# Patient Record
Sex: Male | Born: 1980 | Race: White | Hispanic: No | Marital: Married | Smoking: Current every day smoker
Health system: Southern US, Community
[De-identification: ages and names within clinical notes are randomized; demographics above are authoritative.]

## PROBLEM LIST (undated history)

## (undated) DIAGNOSIS — R569 Unspecified convulsions: Secondary | ICD-10-CM

## (undated) HISTORY — PX: ARTHROSCOPIC REPAIR ACL: SUR80

---
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
## 2013-10-07 ENCOUNTER — Emergency Department (HOSPITAL_COMMUNITY): Payer: Self-pay

## 2013-10-07 ENCOUNTER — Emergency Department (HOSPITAL_COMMUNITY)
Admission: EM | Admit: 2013-10-07 | Discharge: 2013-10-07 | Disposition: A | Discharge status: Home / Self Care | Payer: Self-pay | Attending: Emergency Medicine | Admitting: Emergency Medicine

## 2013-10-07 ENCOUNTER — Encounter (HOSPITAL_COMMUNITY): Payer: Self-pay | Admitting: Emergency Medicine

## 2013-10-07 DIAGNOSIS — W503XXA Accidental bite by another person, initial encounter: Secondary | ICD-10-CM | POA: Insufficient documentation

## 2013-10-07 DIAGNOSIS — R569 Unspecified convulsions: Secondary | ICD-10-CM | POA: Insufficient documentation

## 2013-10-07 DIAGNOSIS — R55 Syncope and collapse: Secondary | ICD-10-CM | POA: Insufficient documentation

## 2013-10-07 DIAGNOSIS — R42 Dizziness and giddiness: Secondary | ICD-10-CM | POA: Insufficient documentation

## 2013-10-07 DIAGNOSIS — Y9389 Activity, other specified: Secondary | ICD-10-CM | POA: Insufficient documentation

## 2013-10-07 DIAGNOSIS — R61 Generalized hyperhidrosis: Secondary | ICD-10-CM | POA: Insufficient documentation

## 2013-10-07 DIAGNOSIS — Y929 Unspecified place or not applicable: Secondary | ICD-10-CM | POA: Insufficient documentation

## 2013-10-07 DIAGNOSIS — Z79899 Other long term (current) drug therapy: Secondary | ICD-10-CM | POA: Insufficient documentation

## 2013-10-07 DIAGNOSIS — S01502A Unspecified open wound of oral cavity, initial encounter: Secondary | ICD-10-CM | POA: Insufficient documentation

## 2013-10-07 LAB — RAPID URINE DRUG SCREEN, HOSP PERFORMED
Barbiturates: NOT DETECTED
Cocaine: NOT DETECTED
Opiates: NOT DETECTED

## 2013-10-07 LAB — CBC WITH DIFFERENTIAL/PLATELET
Eosinophils Relative: 0 % (ref 0–5)
HCT: 44 % (ref 39.0–52.0)
Hemoglobin: 15 g/dL (ref 13.0–17.0)
Lymphocytes Relative: 6 % — ABNORMAL LOW (ref 12–46)
Lymphs Abs: 1 10*3/uL (ref 0.7–4.0)
MCV: 84.8 fL (ref 78.0–100.0)
Monocytes Absolute: 0.6 10*3/uL (ref 0.1–1.0)
Monocytes Relative: 4 % (ref 3–12)
Neutro Abs: 14.1 10*3/uL — ABNORMAL HIGH (ref 1.7–7.7)
Neutrophils Relative %: 90 % — ABNORMAL HIGH (ref 43–77)
RBC: 5.19 MIL/uL (ref 4.22–5.81)
RDW: 13 % (ref 11.5–15.5)
WBC: 15.8 10*3/uL — ABNORMAL HIGH (ref 4.0–10.5)

## 2013-10-07 LAB — COMPREHENSIVE METABOLIC PANEL
CO2: 22 mEq/L (ref 19–32)
Calcium: 9.6 mg/dL (ref 8.4–10.5)
Creatinine, Ser: 0.85 mg/dL (ref 0.50–1.35)
GFR calc Af Amer: >90 mL/min (ref >90)
GFR calc non Af Amer: >90 mL/min (ref >90)
Glucose, Bld: 177 mg/dL — ABNORMAL HIGH (ref 70–99)
Potassium: 4.7 mEq/L (ref 3.5–5.1)
Total Bilirubin: 0.2 mg/dL — ABNORMAL LOW (ref 0.3–1.2)

## 2013-10-07 LAB — URINE MICROSCOPIC-ADD ON

## 2013-10-07 LAB — TROPONIN I: Troponin I: <0.3 ng/mL (ref <0.30)

## 2013-10-07 LAB — URINALYSIS, ROUTINE W REFLEX MICROSCOPIC
Bilirubin Urine: NEGATIVE
Nitrite: NEGATIVE
Specific Gravity, Urine: >1.03 — ABNORMAL HIGH (ref 1.005–1.030)
Urobilinogen, UA: 0.2 mg/dL (ref 0.0–1.0)

## 2013-10-07 MED ORDER — SODIUM CHLORIDE 0.9 % IV SOLN
INTRAVENOUS | Status: DC
  Administered 2013-10-07: 18:00:00 via INTRAVENOUS

## 2013-10-07 MED ORDER — ONDANSETRON HCL 4 MG/2ML IJ SOLN: 4.0000 mg | Freq: Once | INTRAMUSCULAR | Status: DC

## 2013-10-07 MED ORDER — ONDANSETRON HCL 4 MG/2ML IJ SOLN
INTRAMUSCULAR | Status: AC
  Administered 2013-10-07: 4 mg
  Filled 2013-10-07: qty 2
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
## 2013-10-07 NOTE — Progress Notes (Signed)
ED/CM noted patient did not have health insurance and/or PCP listed in the computer.  Patient was given the [place] resource handout with information on the clinics, food pantries, and the handout for new health insurance sign-up.  Patient expressed appreciation for this. 
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
## 2013-10-14 ENCOUNTER — Other Ambulatory Visit: Payer: Self-pay | Admitting: Neurology

## 2013-10-14 DIAGNOSIS — R42 Dizziness and giddiness: Secondary | ICD-10-CM

## 2013-10-29 ENCOUNTER — Encounter (HOSPITAL_COMMUNITY): Payer: Self-pay

## 2013-10-29 ENCOUNTER — Ambulatory Visit (HOSPITAL_COMMUNITY)
Admission: RE | Admit: 2013-10-29 | Discharge: 2013-10-29 | Disposition: A | Discharge status: Home / Self Care | Payer: BC Managed Care – PPO | Source: Ambulatory Visit | Attending: Neurology | Admitting: Neurology

## 2013-10-29 DIAGNOSIS — R42 Dizziness and giddiness: Secondary | ICD-10-CM

## 2013-10-29 DIAGNOSIS — R569 Unspecified convulsions: Secondary | ICD-10-CM | POA: Insufficient documentation

## 2013-10-29 MED ORDER — GADOBENATE DIMEGLUMINE 529 MG/ML IV SOLN
20.0000 mL | Freq: Once | INTRAVENOUS | Status: AC | PRN
  Administered 2013-10-29: 20 mL via INTRAVENOUS

## 2013-11-09 ENCOUNTER — Ambulatory Visit: Payer: Self-pay | Admitting: Neurology

## 2015-09-01 ENCOUNTER — Encounter (HOSPITAL_COMMUNITY): Payer: Self-pay | Admitting: Emergency Medicine

## 2015-09-01 ENCOUNTER — Emergency Department (HOSPITAL_COMMUNITY): Payer: BLUE CROSS/BLUE SHIELD

## 2015-09-01 ENCOUNTER — Emergency Department (HOSPITAL_COMMUNITY)
Admission: EM | Admit: 2015-09-01 | Discharge: 2015-09-01 | Disposition: A | Discharge status: Home / Self Care | Payer: BLUE CROSS/BLUE SHIELD | Attending: Emergency Medicine | Admitting: Emergency Medicine

## 2015-09-01 DIAGNOSIS — Z79899 Other long term (current) drug therapy: Secondary | ICD-10-CM | POA: Diagnosis not present

## 2015-09-01 DIAGNOSIS — M5431 Sciatica, right side: Secondary | ICD-10-CM | POA: Insufficient documentation

## 2015-09-01 DIAGNOSIS — M545 Low back pain: Secondary | ICD-10-CM | POA: Diagnosis present

## 2015-09-01 HISTORY — DX: Unspecified convulsions: R56.9

## 2015-09-01 MED ORDER — OXYCODONE-ACETAMINOPHEN 5-325 MG PO TABS: 1.0000 | ORAL_TABLET | ORAL | Status: DC | PRN

## 2015-09-01 MED ORDER — METHOCARBAMOL 500 MG PO TABS: 500.0000 mg | ORAL_TABLET | Freq: Three times a day (TID) | ORAL | Status: DC

## 2015-09-01 MED ORDER — IBUPROFEN 800 MG PO TABS
800.0000 mg | ORAL_TABLET | Freq: Once | ORAL | Status: AC
  Administered 2015-09-01: 800 mg via ORAL
  Filled 2015-09-01: qty 1

## 2015-09-01 MED ORDER — OXYCODONE-ACETAMINOPHEN 5-325 MG PO TABS
1.0000 | ORAL_TABLET | Freq: Once | ORAL | Status: AC
Start: 2015-09-01 — End: 2015-09-01
  Administered 2015-09-01: 1 via ORAL
  Filled 2015-09-01: qty 1

## 2015-09-01 NOTE — Discharge Instructions (Signed)
°  Sciatica °Sciatica is pain, weakness, numbness, or tingling along your sciatic nerve. The nerve starts in the lower back and runs down the back of each leg. Nerve damage or certain conditions pinch or put pressure on the sciatic nerve. This causes the pain, weakness, and other discomforts of sciatica. °HOME CARE  °· Only take medicine as told by your doctor. °· Apply ice to the affected area for 20 minutes. Do this 3-4 times a day for the first 48-72 hours. Then try heat in the same way. °· Exercise, stretch, or do your usual activities if these do not make your pain worse. °· Go to physical therapy as told by your doctor. °· Keep all doctor visits as told. °· Do not wear high heels or shoes that are not supportive. °· Get a firm mattress if your mattress is too soft to lessen pain and discomfort. °GET HELP RIGHT AWAY IF:  °· You cannot control when you poop (bowel movement) or pee (urinate). °· You have more weakness in your lower back, lower belly (pelvis), butt (buttocks), or legs. °· You have redness or puffiness (swelling) of your back. °· You have a burning feeling when you pee. °· You have pain that gets worse when you lie down. °· You have pain that wakes you from your sleep. °· Your pain is worse than past pain. °· Your pain lasts longer than 4 weeks. °· You are suddenly losing weight without reason. °MAKE SURE YOU:  °· Understand these instructions. °· Will watch this condition. °· Will get help right away if you are not doing well or get worse. °  °This information is not intended to replace advice given to you by your health care provider. Make sure you discuss any questions you have with your health care provider. °  °Document Released: 08/21/2008 Document Revised: 08/03/2015 Document Reviewed: 03/23/2012 °Elsevier Interactive Patient Education ©2016 [employer] ° ° °

## 2015-09-01 NOTE — ED Provider Notes (Signed)
CSN: [id number]     Arrival date & time 09/01/15  0759 History   First MD Initiated Contact with Patient 09/01/15 [phone]     Chief Complaint  Patient presents with  . Back Pain     (Consider location/radiation/quality/duration/timing/severity/associated sxs/prior Treatment) HPI Randall Rose is a 34 y.o. male who presents to the Emergency Department complaining of low back pain for 4 days.  Pain was gradual onset and he describes the pain as sharp and intermittently radiating into his right hip and posterior thigh yesterday, but not today.  Pain is worse with movement, improves at rest.  He took tylenol yesterday without relief.  He states the pain is worse this morning.  Also notes pain is not associated with palpation.  He denies fever, chills, urine or bowel changes, abdominal pain, numbness or weakness of the lower extremities.  Patient is a local high school football coach, but states he does not recall any known injury.    No past medical history on file. No past surgical history on file. No family history on file. Social History  Substance Use Topics  . Smoking status: Never Smoker   . Smokeless tobacco: Not on file  . Alcohol Use: Yes     Comment: occasional     Review of Systems  Constitutional: Negative for fever.  Respiratory: Negative for shortness of breath.   Gastrointestinal: Negative for vomiting, abdominal pain and constipation.  Genitourinary: Negative for dysuria, hematuria, flank pain, decreased urine volume and difficulty urinating.  Musculoskeletal: Positive for back pain. Negative for joint swelling.  Skin: Negative for rash.  Neurological: Negative for weakness and numbness.  All other systems reviewed and are negative.     Allergies  Review of patient's allergies indicates no known allergies.  Home Medications   Prior to Admission medications   Medication Sig Start Date End Date Taking? Authorizing Provider  Sesame Oil OIL Take 1 capsule by mouth  daily.    Historical Provider, MD   BP 148/100 mmHg  Pulse 73  Temp(Src) 97.3 F (36.3 C) (Oral)  Resp 18  Ht  (1.803 m)  Wt 260 lb (117.935 kg)  BMI 36.28 kg/m2  SpO2 95%   Physical Exam  Constitutional: He is oriented to person, place, and time. He appears well-developed and well-nourished. No distress.  HENT:  Head: Normocephalic and atraumatic.  Neck: Normal range of motion. Neck supple.  Cardiovascular: Normal rate, regular rhythm, normal heart sounds and intact distal pulses.   No murmur heard. Pulmonary/Chest: Effort normal and breath sounds normal. No respiratory distress. He exhibits no tenderness.  Abdominal: Soft. He exhibits no distension. There is no tenderness. There is no rebound, no guarding and no CVA tenderness.  Musculoskeletal: He exhibits tenderness. He exhibits no edema.       Lumbar back: He exhibits tenderness and pain. He exhibits normal range of motion, no swelling, no deformity, no laceration and normal pulse.  Describes ttp of the bilateral lumbar paraspinal muscles, mainly on the right   No spinal tenderness.  DP pulses are brisk and symmetrical.  Distal sensation intact.  Hip Flexors/Extensors are intact.  Pt has 5/5 strength against resistance of bilateral lower extremities.     Neurological: He is alert and oriented to person, place, and time. He has normal strength. No sensory deficit. He exhibits normal muscle tone. Coordination and gait normal.  Reflex Scores:      Patellar reflexes are 2+ on the right side and 2+ on the left  side.      Achilles reflexes are 2+ on the right side and 2+ on the left side. Skin: Skin is warm and dry. No rash noted.  Psychiatric: He has a normal mood and affect.  Nursing note and vitals reviewed.   ED Course  Procedures (including critical care time) Labs Review Labs Reviewed - No data to display  Imaging Review Dg Lumbar Spine Complete  09/01/2015   CLINICAL DATA:  34 year old male with lumbar back pain  radiating to the right side and down the right lower extremity for 4 days. No known injury. Initial encounter.  EXAM: LUMBAR SPINE - COMPLETE 4+ VIEW  COMPARISON:  None.  FINDINGS: For the purposes of this report full size ribs are designated at T11 with hypoplastic or absent ribs at T12. Lumbar segmentation subsequently is normal. Bilateral L5 pars fractures. Trace anterolisthesis at L5-S1 associated with disc space loss and endplate spurring. Vertebral height and alignment elsewhere within normal limits. No other pars fracture. Relatively preserved disc spaces elsewhere. sacral ala and SI joints within normal limits.  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   Electronically Signed   By: Odessa Fleming M.D.   On: 09/01/2015 08:46   I have personally reviewed and evaluated these images and lab results as part of my medical decision-making.   EKG Interpretation None      MDM   Final diagnoses:  Sciatica neuralgia, right    Pt is well appearing, ambulatory with a steady giat.  No focal neuro deficits.  No concerning sx's for emergent neurological or infectious process.  Sx's likely related to sciatica.    Pt is feeling slightly better after medication.  Discussed XR findings.  Pt agrees to symptomatic tx and close PMD f/u in one week if not improving.  He appears stable for d/c.  return precautions also given.      Pauline Aus, PA-C 09/01/15 0945  Lavera Guise, MD 09/02/15 [phone]

## 2015-09-01 NOTE — ED Notes (Signed)
Pt reprots lower back pain since Sunday. Pt denies any known injury but reports intermittent radiation to right hip. Pt denies any n/v/d.

## 2021-06-15 ENCOUNTER — Other Ambulatory Visit: Payer: Self-pay

## 2021-06-15 ENCOUNTER — Encounter (HOSPITAL_COMMUNITY): Payer: Self-pay | Admitting: Pharmacy Technician

## 2021-06-15 ENCOUNTER — Emergency Department (HOSPITAL_COMMUNITY)
Admission: EM | Admit: 2021-06-15 | Discharge: 2021-06-16 | Disposition: A | Discharge status: Home / Self Care | Payer: BC Managed Care – PPO | Attending: Emergency Medicine | Admitting: Emergency Medicine

## 2021-06-15 ENCOUNTER — Emergency Department (HOSPITAL_COMMUNITY): Payer: BC Managed Care – PPO

## 2021-06-15 DIAGNOSIS — N132 Hydronephrosis with renal and ureteral calculous obstruction: Secondary | ICD-10-CM | POA: Insufficient documentation

## 2021-06-15 DIAGNOSIS — F17210 Nicotine dependence, cigarettes, uncomplicated: Secondary | ICD-10-CM | POA: Diagnosis not present

## 2021-06-15 DIAGNOSIS — M549 Dorsalgia, unspecified: Secondary | ICD-10-CM | POA: Diagnosis not present

## 2021-06-15 DIAGNOSIS — R109 Unspecified abdominal pain: Secondary | ICD-10-CM | POA: Diagnosis present

## 2021-06-15 DIAGNOSIS — N201 Calculus of ureter: Secondary | ICD-10-CM

## 2021-06-15 LAB — COMPREHENSIVE METABOLIC PANEL
ALT: 23 U/L (ref 0–44)
AST: 15 U/L (ref 15–41)
Albumin: 4.6 g/dL (ref 3.5–5.0)
Alkaline Phosphatase: 60 U/L (ref 38–126)
Anion gap: 10 (ref 5–15)
BUN: 23 mg/dL — ABNORMAL HIGH (ref 6–20)
CO2: 23 mmol/L (ref 22–32)
Calcium: 9.6 mg/dL (ref 8.9–10.3)
Chloride: 105 mmol/L (ref 98–111)
Creatinine, Ser: 1.12 mg/dL (ref 0.61–1.24)
GFR, Estimated: >60 mL/min (ref >60)
Glucose, Bld: 151 mg/dL — ABNORMAL HIGH (ref 70–99)
Potassium: 4 mmol/L (ref 3.5–5.1)
Sodium: 138 mmol/L (ref 135–145)
Total Bilirubin: 0.9 mg/dL (ref 0.3–1.2)
Total Protein: 7.4 g/dL (ref 6.5–8.1)

## 2021-06-15 LAB — URINALYSIS, ROUTINE W REFLEX MICROSCOPIC
Bilirubin Urine: NEGATIVE
Glucose, UA: NEGATIVE mg/dL
Ketones, ur: 20 mg/dL — AB
Leukocytes,Ua: NEGATIVE
Nitrite: NEGATIVE
Protein, ur: 30 mg/dL — AB
Specific Gravity, Urine: 1.031 — ABNORMAL HIGH (ref 1.005–1.030)
pH: 5 (ref 5.0–8.0)

## 2021-06-15 LAB — CBC WITH DIFFERENTIAL/PLATELET
Abs Immature Granulocytes: 0.11 10*3/uL — ABNORMAL HIGH (ref 0.00–0.07)
Basophils Absolute: 0 10*3/uL (ref 0.0–0.1)
Basophils Relative: 0 %
Eosinophils Absolute: 0 10*3/uL (ref 0.0–0.5)
Eosinophils Relative: 0 %
HCT: 44.5 % (ref 39.0–52.0)
Hemoglobin: 14.8 g/dL (ref 13.0–17.0)
Immature Granulocytes: 1 %
Lymphocytes Relative: 12 %
Lymphs Abs: 1.9 10*3/uL (ref 0.7–4.0)
MCH: 28.8 pg (ref 26.0–34.0)
MCHC: 33.3 g/dL (ref 30.0–36.0)
MCV: 86.7 fL (ref 80.0–100.0)
Monocytes Absolute: 0.8 10*3/uL (ref 0.1–1.0)
Monocytes Relative: 5 %
Neutro Abs: 12.5 10*3/uL — ABNORMAL HIGH (ref 1.7–7.7)
Neutrophils Relative %: 82 %
Platelets: 231 10*3/uL (ref 150–400)
RBC: 5.13 MIL/uL (ref 4.22–5.81)
RDW: 12.6 % (ref 11.5–15.5)
WBC: 15.3 10*3/uL — ABNORMAL HIGH (ref 4.0–10.5)
nRBC: 0 % (ref 0.0–0.2)

## 2021-06-15 LAB — LIPASE, BLOOD: Lipase: 26 U/L (ref 11–51)

## 2021-06-15 MED ORDER — OXYCODONE-ACETAMINOPHEN 5-325 MG PO TABS
1.0000 | ORAL_TABLET | Freq: Once | ORAL | Status: AC
  Administered 2021-06-15: 1 via ORAL
  Filled 2021-06-15: qty 1

## 2021-06-15 MED ORDER — ONDANSETRON 4 MG PO TBDP
4.0000 mg | ORAL_TABLET | Freq: Once | ORAL | Status: AC
  Administered 2021-06-15: 4 mg via ORAL
  Filled 2021-06-15: qty 1

## 2021-06-15 MED ORDER — IBUPROFEN 400 MG PO TABS
600.0000 mg | ORAL_TABLET | Freq: Once | ORAL | Status: AC
  Administered 2021-06-15: 600 mg via ORAL
  Filled 2021-06-15: qty 1

## 2021-06-15 NOTE — ED Provider Notes (Signed)
Emergency Medicine Provider Triage Evaluation Note  STEFEN JUBA , a 40 y.o. male  was evaluated in triage.  Pt complains of right sided flank pain. Pain started earlier today and has been constant since than.  No radiation of pain. Endorses difficulty urinating, chills, nausea, and vomiting.  No fevers, dysuria, hematuria.   Review of Systems  Positive: Flank pain, nausea, vomiting, difficulty urinating Negative: Fevers, dysuria, hematuria, abdominal pain  Physical Exam  BP 140/66   Pulse (!) 52   Temp 98.3 F (36.8 C) (Oral)   Resp 18   SpO2 98%  Gen:   Awake, no distress   Resp:  Normal effort  MSK:   Moves extremities without difficulty  Other:  Abdomen soft, nondistended, nontender.  Tenderness to right flank, positive CVA tenderness.  Medical Decision Making  Medically screening exam initiated at 6:25 PM.  Appropriate orders placed.  JEFFRIE LOFSTROM was informed that the remainder of the evaluation will be completed by another provider, this initial triage assessment does not replace that evaluation, and the importance of remaining in the ED until their evaluation is complete.  The patient appears stable so that the remainder of the work up may be completed by another provider.      Berneice Heinrich 06/15/21 Trenton Gammon, MD 06/19/21 1020

## 2021-06-15 NOTE — ED Triage Notes (Signed)
Pt here pov with reports of having an urge to urinate but unable to go. Pt also reports R flank pain. Denies hematuria.

## 2021-06-16 MED ORDER — OXYCODONE-ACETAMINOPHEN 5-325 MG PO TABS: 1.0000 | ORAL_TABLET | Freq: Four times a day (QID) | ORAL | 0 refills | Status: AC | PRN

## 2021-06-16 MED ORDER — TAMSULOSIN HCL 0.4 MG PO CAPS: 0.4000 mg | ORAL_CAPSULE | Freq: Every day | ORAL | 0 refills | Status: AC

## 2021-06-16 MED ORDER — ONDANSETRON 4 MG PO TBDP: 4.0000 mg | ORAL_TABLET | Freq: Three times a day (TID) | ORAL | 0 refills | Status: AC | PRN

## 2021-06-16 MED ORDER — IBUPROFEN 800 MG PO TABS: 800.0000 mg | ORAL_TABLET | Freq: Three times a day (TID) | ORAL | 0 refills | Status: AC | PRN

## 2021-06-16 MED ORDER — KETOROLAC TROMETHAMINE 30 MG/ML IJ SOLN
30.0000 mg | Freq: Once | INTRAMUSCULAR | Status: AC
  Administered 2021-06-16: 30 mg via INTRAMUSCULAR
  Filled 2021-06-16: qty 1

## 2021-06-16 MED ORDER — SENNOSIDES-DOCUSATE SODIUM 8.6-50 MG PO TABS: 1.0000 | ORAL_TABLET | Freq: Every evening | ORAL | 0 refills | Status: AC | PRN

## 2021-06-16 NOTE — Discharge Instructions (Addendum)

## 2021-06-16 NOTE — ED Provider Notes (Signed)
Emergency Department Provider Note   I have reviewed the triage vital signs and the nursing notes.   HISTORY  Chief Complaint Flank Pain   HPI Randall Rose is a 40 y.o. male with past medical history reviewed below presents to the emergency department with flank pain worsening over the past 12 hours.  He is having a cramping pain in his right back and flank with some urine hesitancy.  No gross hematuria.  No fevers or chills.  No pain into the chest.  No prior history of kidney stones.  When pain worsened significantly he presented to the emergency department. Pain is cramping and severe at times. No modifying factors.   Past Medical History:  Diagnosis Date   Seizures (HCC)     There are no problems to display for this patient.   Past Surgical History:  Procedure Laterality Date   ARTHROSCOPIC REPAIR ACL      Allergies Patient has no known allergies.  No family history on file.  Social History Social History   Tobacco Use   Smoking status: Every Day    Packs/day: 1.00    Types: Cigarettes  Substance Use Topics   Alcohol use: Yes    Comment: occasional    Drug use: No    Review of Systems  Constitutional: No fever/chills Eyes: No visual changes. ENT: No sore throat. Cardiovascular: Denies chest pain. Respiratory: Denies shortness of breath. Gastrointestinal: Positive right abdominal pain.  No nausea, no vomiting.  No diarrhea.  No constipation. Genitourinary: Negative for dysuria. Musculoskeletal: Positive for back pain. Skin: Negative for rash. Neurological: Negative for headaches, focal weakness or numbness.  10-point ROS otherwise negative.  ____________________________________________   PHYSICAL EXAM:  VITAL SIGNS: ED Triage Vitals  Enc Vitals Group     BP 06/15/21 1807 140/66     Pulse Rate 06/15/21 1807 (!) 52     Resp 06/15/21 1807 18     Temp 06/15/21 1807 98.3 F (36.8 C)     Temp Source 06/15/21 1807 Oral     SpO2 06/15/21 1807  98 %   Constitutional: Alert and oriented. Well appearing and in no acute distress. Eyes: Conjunctivae are normal.  Head: Atraumatic. Nose: No congestion/rhinnorhea. Mouth/Throat: Mucous membranes are moist.  Neck: No stridor.   Cardiovascular:Good peripheral circulation.  Respiratory: Normal respiratory effort.   Gastrointestinal: Soft and nontender. No distention.  Musculoskeletal: No gross deformities of extremities. Neurologic:  Normal speech and language.  Skin:  Skin is warm, dry and intact. No rash noted.  ____________________________________________   LABS (all labs ordered are listed, but only abnormal results are displayed)  Labs Reviewed  COMPREHENSIVE METABOLIC PANEL - Abnormal; Notable for the following components:      Result Value   Glucose, Bld 151 (*)    BUN 23 (*)    All other components within normal limits  CBC WITH DIFFERENTIAL/PLATELET - Abnormal; Notable for the following components:   WBC 15.3 (*)    Neutro Abs 12.5 (*)    Abs Immature Granulocytes 0.11 (*)    All other components within normal limits  URINALYSIS, ROUTINE W REFLEX MICROSCOPIC - Abnormal; Notable for the following components:   Color, Urine AMBER (*)    APPearance TURBID (*)    Specific Gravity, Urine 1.031 (*)    Hgb urine dipstick MODERATE (*)    Ketones, ur 20 (*)    Protein, ur 30 (*)    Bacteria, UA FEW (*)    All other components  within normal limits  URINE CULTURE  LIPASE, BLOOD   ____________________________________________  RADIOLOGY  No results found.  ____________________________________________   PROCEDURES  Procedure(s) performed:   Procedures  None  ____________________________________________   INITIAL IMPRESSION / ASSESSMENT AND PLAN / ED COURSE  Pertinent labs & imaging results that were available during my care of the patient were reviewed by me and considered in my medical decision making (see chart for details).   Patient presents emergency  department the right abdominal and back pain.  Hematuria on UA without infection.  CT renal ordered in triage shows a 5 mm stone in the distal right ureter with mild hydronephrosis.  Patient's pain is well controlled here in the emergency department.  Discussed follow-up with urology and pain control strategies at home.  Medication prescribed after review of [place] drug database.  Discussed ED return precautions.    ____________________________________________  FINAL CLINICAL IMPRESSION(S) / ED DIAGNOSES  Final diagnoses:  Ureteral stone     MEDICATIONS GIVEN DURING THIS VISIT:  Medications  ondansetron (ZOFRAN-ODT) disintegrating tablet 4 mg (4 mg Oral Given 06/15/21 1854)  oxyCODONE-acetaminophen (PERCOCET/ROXICET) 5-325 MG per tablet 1 tablet (1 tablet Oral Given 06/15/21 1854)  oxyCODONE-acetaminophen (PERCOCET/ROXICET) 5-325 MG per tablet 1 tablet (1 tablet Oral Given 06/15/21 2103)  ibuprofen (ADVIL) tablet 600 mg (600 mg Oral Given 06/15/21 2103)  ketorolac (TORADOL) 30 MG/ML injection 30 mg (30 mg Intramuscular Given 06/16/21 0601)     NEW OUTPATIENT MEDICATIONS STARTED DURING THIS VISIT:  Discharge Medication List as of 06/16/2021  6:50 AM     START taking these medications   Details  ibuprofen (ADVIL) 800 MG tablet Take 1 tablet (800 mg total) by mouth every 8 (eight) hours as needed for moderate pain., Starting Fri 06/16/2021, Normal    ondansetron (ZOFRAN ODT) 4 MG disintegrating tablet Take 1 tablet (4 mg total) by mouth every 8 (eight) hours as needed for nausea or vomiting., Starting Fri 06/16/2021, Normal    oxyCODONE-acetaminophen (PERCOCET/ROXICET) 5-325 MG tablet Take 1 tablet by mouth every 6 (six) hours as needed for severe pain., Starting Fri 06/16/2021, Normal    senna-docusate (SENOKOT-S) 8.6-50 MG tablet Take 1 tablet by mouth at bedtime as needed for mild constipation., Starting Fri 06/16/2021, Until Sun 07/16/2021 at 2359, Normal    tamsulosin (FLOMAX)  0.4 MG CAPS capsule Take 1 capsule (0.4 mg total) by mouth daily for 14 days., Starting Fri 06/16/2021, Until Fri 06/30/2021, Normal        Note:  This document was prepared using Dragon voice recognition software and may include unintentional dictation errors.  Alona Bene, MD, [facility] Emergency Medicine    Layni Kreamer, Arlyss Repress, MD 06/16/21 [phone]

## 2021-06-17 LAB — URINE CULTURE: Culture: NO GROWTH

## 2022-11-06 IMAGING — CT CT RENAL STONE PROTOCOL
2 of 4 series · 16 of 46 positions shown, 18 images · non-contrast
Comparison: None.

CLINICAL DATA: Flank pain, kidney stone suspected. Right flank
pain.

EXAM:
CT ABDOMEN AND PELVIS WITHOUT CONTRAST
TECHNIQUE: Multidetector CT imaging of the abdomen and pelvis was performed
following the standard protocol without IV contrast.

[Series 3: renal stone 5.0 · axial · 0.98mm/px · z∈[+580,+1105]mm · 13 of 117 slices shown, 15 images]
[im 6/117  soft-tissue]
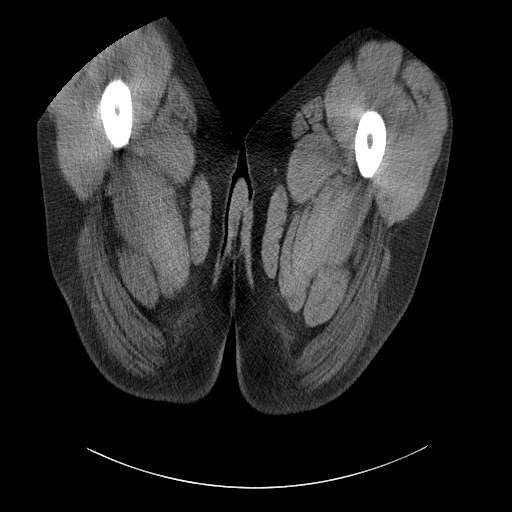
[im 6/117  bone]
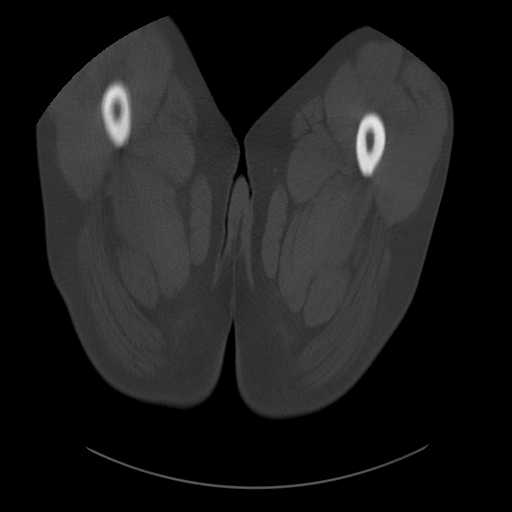
[im 16/117  soft-tissue]
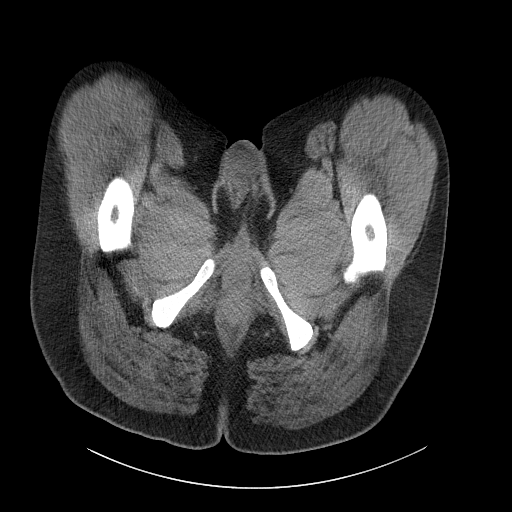
[im 27/117  soft-tissue]
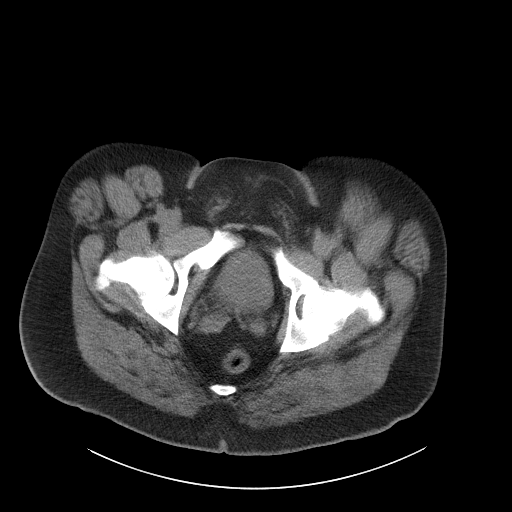
[im 32/117  soft-tissue]
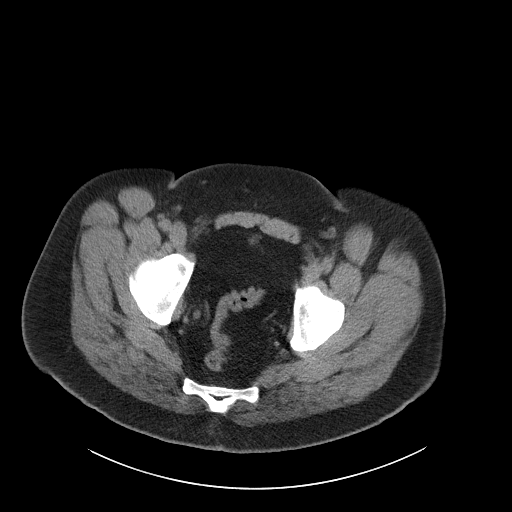
[im 43/117  soft-tissue]
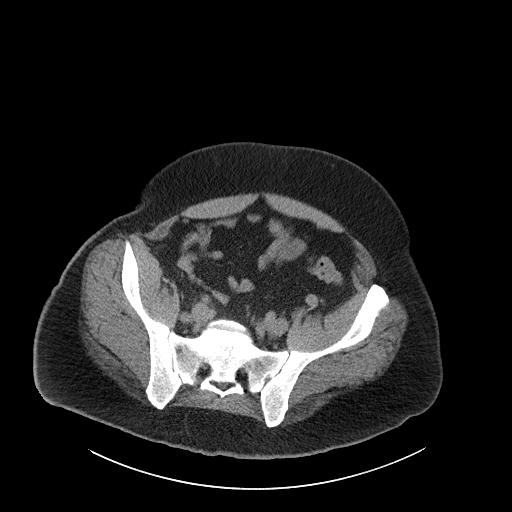
[im 48/117  soft-tissue]
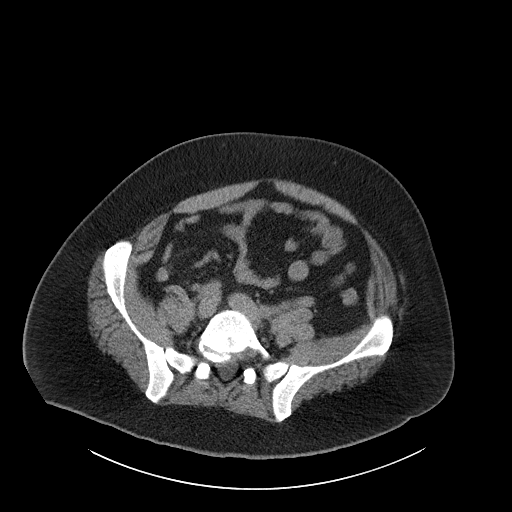
[im 59/117  soft-tissue]
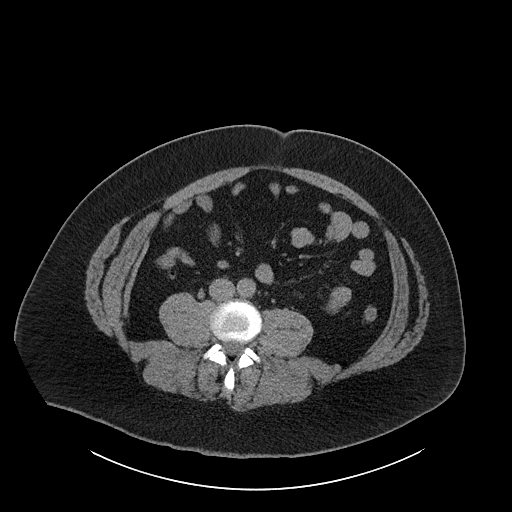
[im 69/117  soft-tissue]
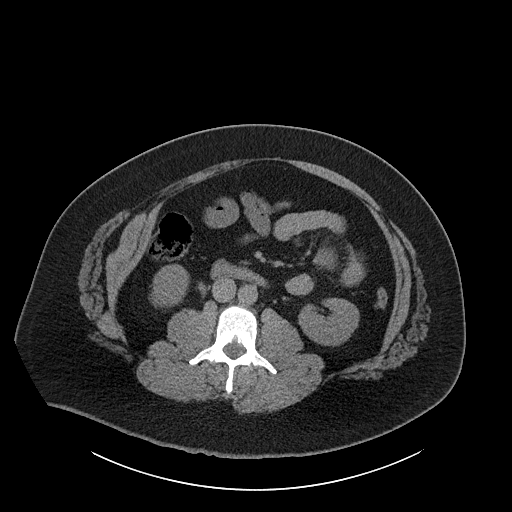
[im 74/117  soft-tissue]
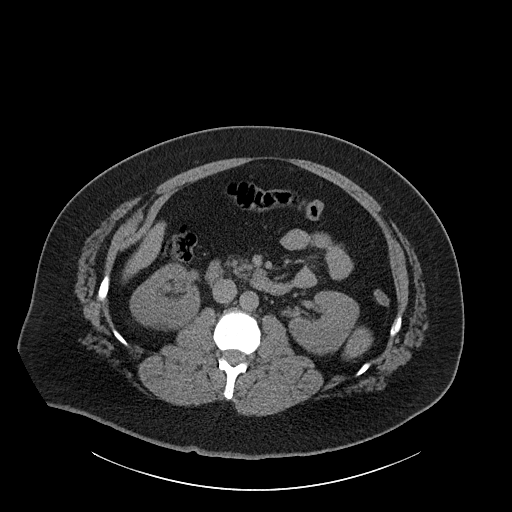
[im 74/117  bone]
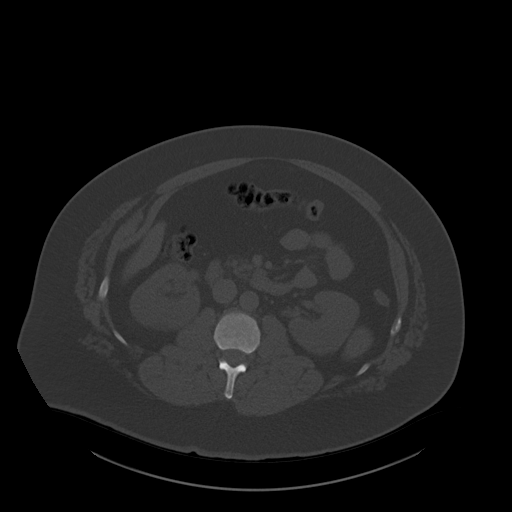
[im 85/117  soft-tissue]
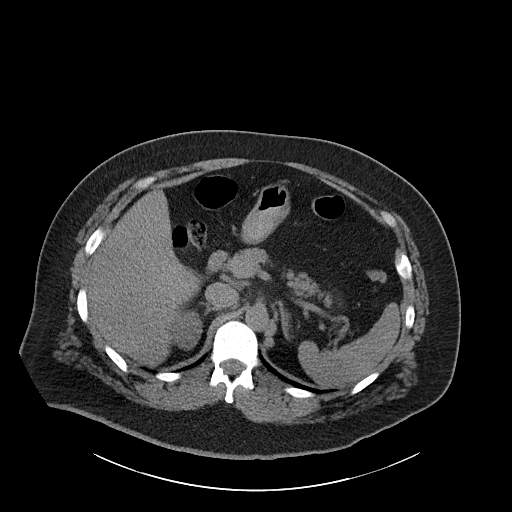
[im 90/117  soft-tissue]
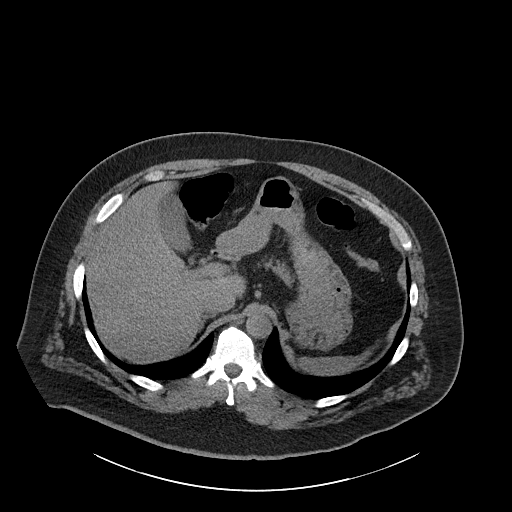
[im 101/117  soft-tissue]
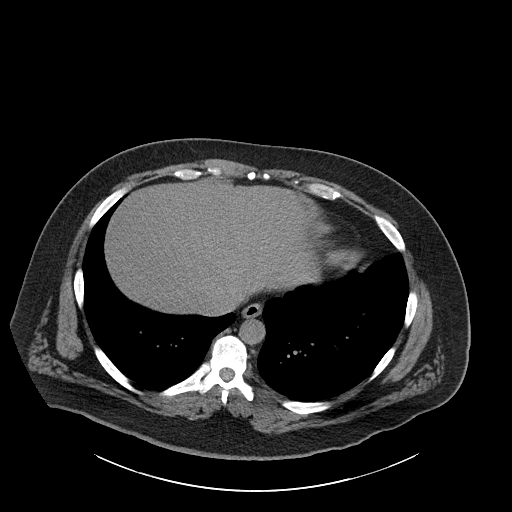
[im 111/117  soft-tissue]
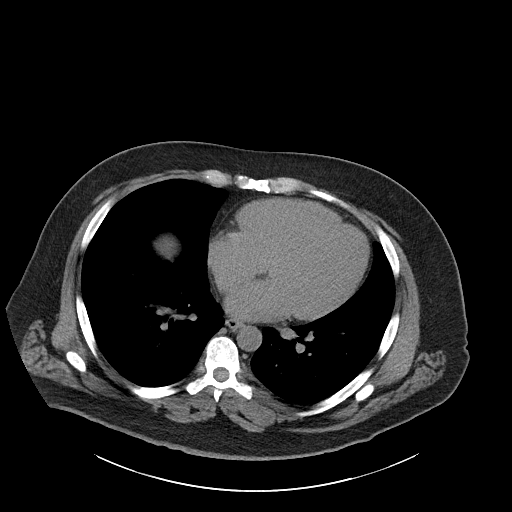

[Series 6: cor · coronal · 0.87mm/px · 3 of 169 slices shown]
[im 57/169  soft-tissue]
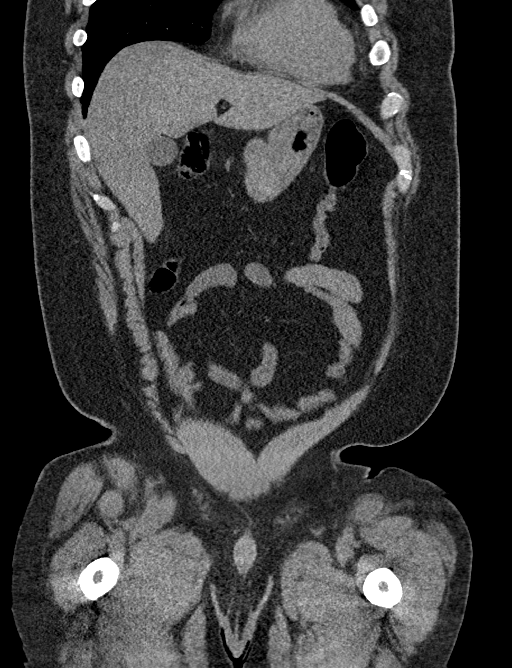
[im 75/169  soft-tissue]
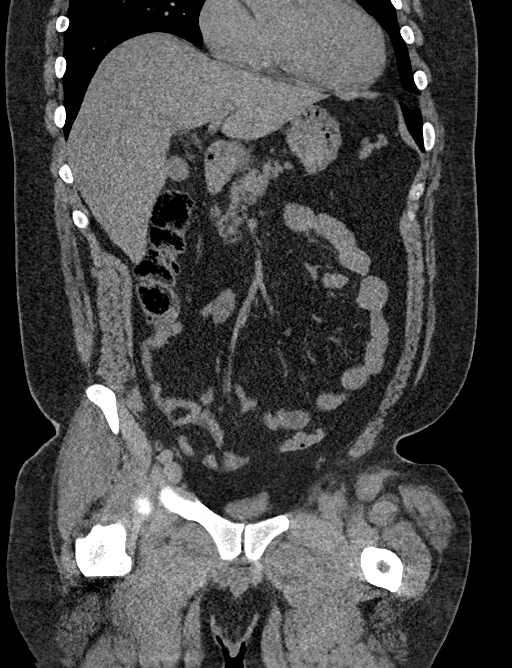
[im 94/169  soft-tissue]
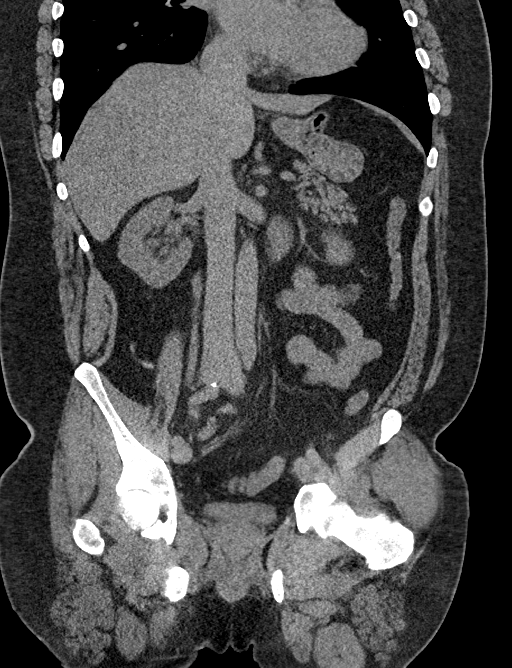

[16 of 46 positions shown; findings below may reference images not displayed]

FINDINGS: LOWER CHEST: Normal.

HEPATOBILIARY: Normal hepatic contours. No intra- or extrahepatic
biliary dilatation. The gallbladder is normal.

PANCREAS: Normal pancreas. No ductal dilatation or peripancreatic
fluid collection.

SPLEEN: Normal.

ADRENALS/URINARY TRACT: The adrenal glands are normal. There is a
stone within the distal ureter measuring 5 mm, causing mild
hydroureteronephrosis and minimal perinephric stranding. No other
renal stones. The urinary bladder is normal for degree of distention

STOMACH/BOWEL: There is no hiatal hernia. Normal duodenal course and
caliber. No small bowel dilatation or inflammation. No focal colonic
abnormality. Normal appendix.

VASCULAR/LYMPHATIC: Normal course and caliber of the major abdominal
vessels. No abdominal or pelvic lymphadenopathy.

REPRODUCTIVE: Normal prostate size with symmetric seminal vesicles.

MUSCULOSKELETAL. No bony spinal canal stenosis or focal osseous
abnormality.

OTHER: None.
IMPRESSION: Right-sided obstructive uropathy with 5 mm stone in the distal right
ureter causing mild hydroureteronephrosis and minimal perinephric
stranding.
